# Patient Record
Sex: Male | Born: 1985 | Race: Black or African American | Hispanic: No | Marital: Married | Smoking: Never smoker
Health system: Southern US, Community
[De-identification: ages and names within clinical notes are randomized; demographics above are authoritative.]

## PROBLEM LIST (undated history)

## (undated) DIAGNOSIS — J45909 Unspecified asthma, uncomplicated: Secondary | ICD-10-CM

## (undated) NOTE — ED Triage Notes (Signed)
 Formatting of this note might be different from the original. Ricky Sosa is a 73 yr old male presenting with a chief complaint of abdominal pain,diarrhea and vomiting since Thursday.Symptoms started after dining out Electronically signed by Candia Alken (R.N.), R.N. at 12/23/2021  3:19 PM EDT

## (undated) NOTE — ED Provider Notes (Signed)
 Formatting of this note might be different from the original. CC:  ABDOMINAL PAIN  HPI: Ricky Sosa is a 86 yr old male with a h/o no PMH presenting with diarrhea x5/day for 4 days. Reports vomiting for first 3 days, none today. Has not tried medications. 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 Denies abdominal pain, blood in vomit or stool, fevers, travel, new foods or any other concerns.  PMH:  There is no problem list on file for this patient.  ALLERGIES No Known Allergies  Review of Systems:  CONSTITUTIONAL: Denies: chills, fever ENT: Denies: sore throat CARD: Denies: CP RESP: Denies: cough, SOB GU: Denies: dysuria, frequency NEURO: Denies: HA, dizziness SKIN: Denies: rash, skin changes  BP 123/78   Pulse 75   Temp 98.1 F (36.7 C) (Oral)   Resp 18   Ht 5' 6 (1.676 m)   Wt 199 lb (90.3 kg)   SpO2 99%   BMI 32.12 kg/m   Physical Exam: vitals wnl GEN: Vitals reviewed, no apparent distress, well-appearing HEENT: PERRL, EOMI ABDOMEN: Soft, mild diffuse ttp with more lower ttp, ND, no peritoneal signs, neg mcburneys and murphys, no HSM NEURO: Awake, alert, nl speech SKIN: Warm, dry, no rash  Assessment/Plan: Ricky Sosa is a 50 yr old male presenting with vomiting diarrhea. Likely causes include toxic food ingestion vs viral syndrome.  Low suspicion for appendicitis, cholecystitis, pancreatitis, pyelonephritis, severe colitis, or other acutely concerning explanation for the patients symptoms based on patient's history and presentation.  - Labs - covid/flu - Imaging -  not indicated - Meds - zofran, imodium - Patient education - Follow up with PCP  Completed by:   JONATHAN G FLEURAT MD, December 23, 2021, 3:44 PM  Patient understands importance of close follow up with PCP for further evaluation of symptoms, and to return immediately for worsening abdominal pain, fevers, new symptoms, or any other concerns.   Ricky Sosa  (M.D.), M.D. 12/23/21 1609  Electronically signed by Ricky Sosa (M.D.), M.D. at 12/23/2021  4:09 PM EDT

---

## 2006-12-01 ENCOUNTER — Emergency Department (HOSPITAL_COMMUNITY): Admission: EM | Admit: 2006-12-01 | Discharge: 2006-12-02 | Payer: Self-pay | Admitting: Emergency Medicine

## 2008-02-14 ENCOUNTER — Emergency Department (HOSPITAL_COMMUNITY): Admission: EM | Admit: 2008-02-14 | Discharge: 2008-02-14 | Payer: Self-pay | Admitting: Emergency Medicine

## 2010-10-30 ENCOUNTER — Ambulatory Visit (HOSPITAL_COMMUNITY): Admission: AD | Admit: 2010-10-30 | Discharge: 2010-10-30 | Payer: Self-pay | Attending: Urology | Admitting: Urology

## 2010-10-30 ENCOUNTER — Emergency Department (HOSPITAL_COMMUNITY)
Admission: EM | Admit: 2010-10-30 | Discharge: 2010-10-30 | Payer: Self-pay | Source: Home / Self Care | Admitting: Emergency Medicine

## 2010-10-31 LAB — URINALYSIS, ROUTINE W REFLEX MICROSCOPIC
Bilirubin Urine: NEGATIVE
Hgb urine dipstick: NEGATIVE
Ketones, ur: 15 mg/dL — AB
Nitrite: NEGATIVE
Protein, ur: NEGATIVE mg/dL
Specific Gravity, Urine: 1.028 (ref 1.005–1.030)
Urine Glucose, Fasting: NEGATIVE mg/dL
Urobilinogen, UA: 1 mg/dL (ref 0.0–1.0)
pH: 7 (ref 5.0–8.0)

## 2010-10-31 LAB — BASIC METABOLIC PANEL
BUN: 12 mg/dL (ref 6–23)
CO2: 26 mEq/L (ref 19–32)
Calcium: 9.3 mg/dL (ref 8.4–10.5)
Chloride: 103 mEq/L (ref 96–112)
Creatinine, Ser: 1.08 mg/dL (ref 0.4–1.5)
GFR calc Af Amer: >60 mL/min (ref >60)
GFR calc non Af Amer: >60 mL/min (ref >60)
Glucose, Bld: 117 mg/dL — ABNORMAL HIGH (ref 70–99)
Potassium: 3.7 mEq/L (ref 3.5–5.1)
Sodium: 139 mEq/L (ref 135–145)

## 2010-10-31 LAB — CBC
HCT: 42 % (ref 39.0–52.0)
Hemoglobin: 13.5 g/dL (ref 13.0–17.0)
MCH: 23.1 pg — ABNORMAL LOW (ref 26.0–34.0)
MCHC: 32.1 g/dL (ref 30.0–36.0)
MCV: 71.9 fL — ABNORMAL LOW (ref 78.0–100.0)
Platelets: 169 10*3/uL (ref 150–400)
RBC: 5.84 MIL/uL — ABNORMAL HIGH (ref 4.22–5.81)
RDW: 14.4 % (ref 11.5–15.5)
WBC: 7.1 10*3/uL (ref 4.0–10.5)

## 2010-10-31 LAB — DIFFERENTIAL
Basophils Absolute: 0 10*3/uL (ref 0.0–0.1)
Basophils Relative: 0 % (ref 0–1)
Eosinophils Absolute: 0 10*3/uL (ref 0.0–0.7)
Eosinophils Relative: 0 % (ref 0–5)
Lymphocytes Relative: 9 % — ABNORMAL LOW (ref 12–46)
Lymphs Abs: 0.7 10*3/uL (ref 0.7–4.0)
Monocytes Absolute: 0.4 10*3/uL (ref 0.1–1.0)
Monocytes Relative: 5 % (ref 3–12)
Neutro Abs: 6 10*3/uL (ref 1.7–7.7)
Neutrophils Relative %: 85 % — ABNORMAL HIGH (ref 43–77)

## 2010-11-26 NOTE — Consult Note (Signed)
NAMEJAMARION, Ricky Sosa               ACCOUNT NO.:  [id number]  MEDICAL RECORD NO.:  [id number]          PATIENT TYPE:  EMS  LOCATION:  [facility]                         FACILITY:  [facility]  PHYSICIAN:  Danae Chen, M.D.  DATE OF BIRTH:  [birth year]  DATE OF CONSULTATION:  10/30/2010 DATE OF DISCHARGE:                                CONSULTATION   REASON FOR CONSULTATION:  Right testicular pain.  The patient is 25 year old male who came to the emergency room this morning with history of sudden onset of severe right testicular pain. The pain is associated with swelling of the right scrotum.  He is sexually active.  He does not have any discharge from the penis.  He does not have any voiding symptoms.  He does not have any history of prior testicular pain.  A scrotal ultrasound showed no flow to the right testis.  I was then asked to see the patient in consultation for management of testicular torsion.  PAST MEDICAL HISTORY:  Negative for diabetes, hypertension.  He has a history of asthma and he had open heart surgery in the past.  SOCIAL HISTORY:  He is single.  He is a Consulting civil engineer at [employer]  He does not smoke nor drink.  FAMILY HISTORY:  His father and mother are alive and doing well.  MEDICATIONS:  None.  ALLERGIES:  No known drug allergies.  REVIEW OF SYSTEMS:  As noted in the HPI and everything else is negative.  PHYSICAL EXAMINATION:  GENERAL:  This is a well-developed 25 year old male who is complaining of severe testicular pain.  He is alert and oriented to time, place, and person. VITAL SIGNS:  His blood pressure is 136/62, pulse 59, respirations 20, temperature 98.  His head is normal.  He has pink conjunctivae. EARS, NOSE AND THROAT:  Within normal limits. NECK:  Supple.  He has no cervical adenopathy.  No thyromegaly. CHEST:  Symmetrical. LUNGS:  Fully expanded and clear to percussion and auscultation. HEART:  Regular rhythm.  His abdomen is  soft and nondistended, nontender.  He has no CVA tenderness.  Kidneys are not palpable. Bladder is not distended.  He has no hepatomegaly, no splenomegaly.  He has no inguinal hernia.  No inguinal adenopathy.  Bowel sounds are normal. GENITALIA:  Penis is circumcised.  Meatus is normal.  The right scrotum is swollen.  The right testicle is exquisitely tender.  The right spermatic cord is also tender. EXTREMITIES:  Within normal limits.  He has no pedal edema.  No deformities. SKIN:  Warm and dry.  LABORATORY DATA:  His hemoglobin is 13.5, hematocrit 42.0, and WBC 7.1, BUN 12, creatinine 1.08.  Sodium 139, potassium 3.7.  IMPRESSION:  Torsion of right testis.  PLAN:  Exploration of the scrotum,  bilateral orchiopexy, and possible right orchiectomy.  This was discussed in detail with the patient.  He understands that if the testicle is not viable that we would have to done orchiectomy.     Danae Chen, M.D.     MN/MEDQ  D:  10/30/2010  T:  10/30/2010  Job:  [id number]  Electronically Signed by Lindaann Slough M.D. on 10/31/2010 07:39:03 AM

## 2016-01-11 ENCOUNTER — Emergency Department (HOSPITAL_COMMUNITY)
Admission: EM | Admit: 2016-01-11 | Discharge: 2016-01-11 | Disposition: A | Discharge status: Home / Self Care | Payer: Self-pay | Attending: Emergency Medicine | Admitting: Emergency Medicine

## 2016-01-11 ENCOUNTER — Emergency Department (HOSPITAL_COMMUNITY): Payer: Self-pay

## 2016-01-11 ENCOUNTER — Encounter (HOSPITAL_COMMUNITY): Payer: Self-pay | Admitting: Emergency Medicine

## 2016-01-11 DIAGNOSIS — B9789 Other viral agents as the cause of diseases classified elsewhere: Secondary | ICD-10-CM

## 2016-01-11 DIAGNOSIS — R197 Diarrhea, unspecified: Secondary | ICD-10-CM | POA: Insufficient documentation

## 2016-01-11 DIAGNOSIS — M791 Myalgia: Secondary | ICD-10-CM | POA: Insufficient documentation

## 2016-01-11 DIAGNOSIS — R110 Nausea: Secondary | ICD-10-CM | POA: Insufficient documentation

## 2016-01-11 DIAGNOSIS — J45901 Unspecified asthma with (acute) exacerbation: Secondary | ICD-10-CM | POA: Insufficient documentation

## 2016-01-11 DIAGNOSIS — J069 Acute upper respiratory infection, unspecified: Secondary | ICD-10-CM | POA: Insufficient documentation

## 2016-01-11 HISTORY — DX: Unspecified asthma, uncomplicated: J45.909

## 2016-01-11 LAB — CBC WITH DIFFERENTIAL/PLATELET
Basophils Absolute: 0 10*3/uL (ref 0.0–0.1)
Basophils Relative: 1 %
Eosinophils Absolute: 0 10*3/uL (ref 0.0–0.7)
Eosinophils Relative: 2 %
HCT: 43.3 % (ref 39.0–52.0)
Hemoglobin: 13.9 g/dL (ref 13.0–17.0)
Lymphocytes Relative: 31 %
Lymphs Abs: 0.9 10*3/uL (ref 0.7–4.0)
MCH: 23.7 pg — ABNORMAL LOW (ref 26.0–34.0)
MCHC: 32.1 g/dL (ref 30.0–36.0)
MCV: 73.8 fL — ABNORMAL LOW (ref 78.0–100.0)
Monocytes Absolute: 0.4 10*3/uL (ref 0.1–1.0)
Monocytes Relative: 14 %
Neutro Abs: 1.4 10*3/uL — ABNORMAL LOW (ref 1.7–7.7)
Neutrophils Relative %: 52 %
Platelets: 159 10*3/uL (ref 150–400)
RBC: 5.87 MIL/uL — ABNORMAL HIGH (ref 4.22–5.81)
RDW: 14.7 % (ref 11.5–15.5)
WBC: 2.7 10*3/uL — ABNORMAL LOW (ref 4.0–10.5)

## 2016-01-11 LAB — BASIC METABOLIC PANEL
Anion gap: 9 (ref 5–15)
BUN: 6 mg/dL (ref 6–20)
CO2: 28 mmol/L (ref 22–32)
Calcium: 8.8 mg/dL — ABNORMAL LOW (ref 8.9–10.3)
Chloride: 101 mmol/L (ref 101–111)
Creatinine, Ser: 1.18 mg/dL (ref 0.61–1.24)
GFR calc Af Amer: >60 mL/min (ref >60)
GFR calc non Af Amer: >60 mL/min (ref >60)
Glucose, Bld: 94 mg/dL (ref 65–99)
Potassium: 3.9 mmol/L (ref 3.5–5.1)
Sodium: 138 mmol/L (ref 135–145)

## 2016-01-11 MED ORDER — SODIUM CHLORIDE 0.9 % IV BOLUS (SEPSIS)
1000.0000 mL | Freq: Once | INTRAVENOUS | Status: AC
  Administered 2016-01-11: 1000 mL via INTRAVENOUS

## 2016-01-11 MED ORDER — GUAIFENESIN-CODEINE 100-10 MG/5ML PO SOLN: 5.0000 mL | Freq: Four times a day (QID) | ORAL | Status: DC | PRN

## 2016-01-11 NOTE — Discharge Instructions (Signed)
Upper Respiratory Infection, Adult Most upper respiratory infections (URIs) are a viral infection of the air passages leading to the lungs. A URI affects the nose, throat, and upper air passages. The most common type of URI is nasopharyngitis and is typically referred to as "the common cold." URIs run their course and usually go away on their own. Most of the time, a URI does not require medical attention, but sometimes a bacterial infection in the upper airways can follow a viral infection. This is called a secondary infection. Sinus and middle ear infections are common types of secondary upper respiratory infections. Bacterial pneumonia can also complicate a URI. A URI can worsen asthma and chronic obstructive pulmonary disease (COPD). Sometimes, these complications can require emergency medical care and may be life threatening.  CAUSES Almost all URIs are caused by viruses. A virus is a type of germ and can spread from one person to another.  RISKS FACTORS You may be at risk for a URI if:   You smoke.   You have chronic heart or lung disease.  You have a weakened defense (immune) system.   You are very young or very old.   You have nasal allergies or asthma.  You work in crowded or poorly ventilated areas.  You work in health care facilities or schools. SIGNS AND SYMPTOMS  Symptoms typically develop 2-3 days after you come in contact with a cold virus. Most viral URIs last 7-10 days. However, viral URIs from the influenza virus (flu virus) can last 14-18 days and are typically more severe. Symptoms may include:   Runny or stuffy (congested) nose.   Sneezing.   Cough.   Sore throat.   Headache.   Fatigue.   Fever.   Loss of appetite.   Pain in your forehead, behind your eyes, and over your cheekbones (sinus pain).  Muscle aches.  DIAGNOSIS  Your health care provider may diagnose a URI by:  Physical exam.  Tests to check that your symptoms are not due to  another condition such as:  Strep throat.  Sinusitis.  Pneumonia.  Asthma. TREATMENT  A URI goes away on its own with time. It cannot be cured with medicines, but medicines may be prescribed or recommended to relieve symptoms. Medicines may help:  Reduce your fever.  Reduce your cough.  Relieve nasal congestion. HOME CARE INSTRUCTIONS   Take medicines only as directed by your health care provider.   Gargle warm saltwater or take cough drops to comfort your throat as directed by your health care provider.  Use a warm mist humidifier or inhale steam from a shower to increase air moisture. This may make it easier to breathe.  Drink enough fluid to keep your urine clear or pale yellow.   Eat soups and other clear broths and maintain good nutrition.   Rest as needed.   Return to work when your temperature has returned to normal or as your health care provider advises. You may need to stay home longer to avoid infecting others. You can also use a face mask and careful hand washing to prevent spread of the virus.  Increase the usage of your inhaler if you have asthma.   Do not use any tobacco products, including cigarettes, chewing tobacco, or electronic cigarettes. If you need help quitting, ask your health care provider. PREVENTION  The best way to protect yourself from getting a cold is to practice good hygiene.   Avoid oral or hand contact with people with cold   symptoms.   Wash your hands often if contact occurs.  There is no clear evidence that vitamin C, vitamin E, echinacea, or exercise reduces the chance of developing a cold. However, it is always recommended to get plenty of rest, exercise, and practice good nutrition.  SEEK MEDICAL CARE IF:   You are getting worse rather than better.   Your symptoms are not controlled by medicine.   You have chills.  You have worsening shortness of breath.  You have brown or red mucus.  You have yellow or brown nasal  discharge.  You have pain in your face, especially when you bend forward.  You have a fever.  You have swollen neck glands.  You have pain while swallowing.  You have white areas in the back of your throat. SEEK IMMEDIATE MEDICAL CARE IF:   You have severe or persistent:  Headache.  Ear pain.  Sinus pain.  Chest pain.  You have chronic lung disease and any of the following:  Wheezing.  Prolonged cough.  Coughing up blood.  A change in your usual mucus.  You have a stiff neck.  You have changes in your:  Vision.  Hearing.  Thinking.  Mood. MAKE SURE YOU:   Understand these instructions.  Will watch your condition.  Will get help right away if you are not doing well or get worse.   This information is not intended to replace advice given to you by your health care provider. Make sure you discuss any questions you have with your health care provider.   Document Released: 03/26/2001 Document Revised: 02/14/2015 Document Reviewed: 01/05/2014 Elsevier Interactive Patient Education 2016 Elsevier Inc[employer]  

## 2016-01-11 NOTE — ED Provider Notes (Signed)
CSN: [id number]     Arrival date & time 01/11/16  2108 History  By signing my name below, I, Tanda RockersMargaux Venter, attest that this documentation has been prepared under the direction and in the presence of Felicie Mornavid Mary-Ann Pennella, NP. Electronically Signed: Tanda RockersMargaux Venter, ED Scribe. 01/11/2016. 9:40 PM.   Chief Complaint  Patient presents with  . Shortness of Breath  . Headache  . Cough  . Generalized Body Aches   Patient is a 30 y.o. male presenting with shortness of breath, headaches, and cough. The history is provided by the patient. No language interpreter was used.  Shortness of Breath Severity:  Moderate Onset quality:  Gradual Duration:  5 days Timing:  Constant Progression:  Unchanged Chronicity:  New Relieved by:  None tried Worsened by:  Nothing tried Ineffective treatments:  None tried Associated symptoms: cough, fever and headaches   Associated symptoms: no abdominal pain and no vomiting   Headache Associated symptoms: cough, diarrhea, fever, myalgias and nausea   Associated symptoms: no abdominal pain and no vomiting   Cough Associated symptoms: chills, fever, headaches, myalgias and shortness of breath      HPI Comments: Ricky Sosa is a 30 y.o. male with PMHx asthma who presents to the Emergency Department complaining of gradual onset, constant, cough with shortness of breath x 5 days. Pt also complains of intermittent sharp posterior headache, generalized body aches, fever, chills, nausea, and diarrhea. He was taking Nyquil earlier in the week but stopped after it was giving him no relief. No recent sick contact but girlfriend does work at a school and could have picked something up there. Denies vomiting, abdominal pain, or any other associated symptoms.   Past Medical History  Diagnosis Date  . Asthma    No past surgical history on file. No family history on file. Social History  Substance Use Topics  . Smoking status: Never Smoker   . Smokeless tobacco: Not on file  .  Alcohol Use: Not on file    Review of Systems  Constitutional: Positive for fever and chills.  Respiratory: Positive for cough and shortness of breath.   Gastrointestinal: Positive for nausea and diarrhea. Negative for vomiting and abdominal pain.  Musculoskeletal: Positive for myalgias.  Neurological: Positive for headaches.  All other systems reviewed and are negative.  Allergies  Review of patient's allergies indicates no known allergies.  Home Medications   Prior to Admission medications   Not on File   BP 127/67 mmHg  Pulse 83  Temp(Src) 98.6 F (37 C) (Oral)  Resp 16  Ht 5\' 6"  (1.676 m)  Wt 205 lb (92.987 kg)  BMI 33.10 kg/m2  SpO2 100%   Physical Exam  Constitutional: He is oriented to person, place, and time. He appears well-developed and well-nourished. No distress.  HENT:  Head: Normocephalic and atraumatic.  Right Ear: Tympanic membrane normal.  Left Ear: Tympanic membrane normal.  Mouth/Throat: Posterior oropharyngeal erythema present.  Eyes: Conjunctivae and EOM are normal.  Neck: Neck supple. No tracheal deviation present.  Cardiovascular: Normal rate.   Pulmonary/Chest: Effort normal and breath sounds normal. No respiratory distress. He has no wheezes. He has no rales.  Abdominal: Soft. There is no tenderness.  Musculoskeletal: Normal range of motion.  Neurological: He is alert and oriented to person, place, and time.  Skin: Skin is warm and dry.  Psychiatric: He has a normal mood and affect. His behavior is normal.  Nursing note and vitals reviewed.   ED Course  Procedures (including critical  care time)  DIAGNOSTIC STUDIES: Oxygen Saturation is 100% on RA, normal by my interpretation.    COORDINATION OF CARE: 9:40 PM-Discussed treatment plan which includes CXR with pt at bedside and pt agreed to plan.   Labs Review Labs Reviewed  CBC WITH DIFFERENTIAL/PLATELET - Abnormal; Notable for the following:    WBC 2.7 (*)    RBC 5.87 (*)    MCV  73.8 (*)    MCH 23.7 (*)    Neutro Abs 1.4 (*)    All other components within normal limits  BASIC METABOLIC PANEL - Abnormal; Notable for the following:    Calcium 8.8 (*)    All other components within normal limits    Imaging Review Dg Chest 2 View  01/11/2016  CLINICAL DATA:  Acute onset of productive cough, fever, shortness of breath, diarrhea and generalized aching. Initial encounter. EXAM: CHEST  2 VIEW COMPARISON:  Chest radiograph from 02/14/2008 FINDINGS: The lungs are well-aerated and clear. There is no evidence of focal opacification, pleural effusion or pneumothorax. The heart is normal in size; the mediastinal contour is within normal limits. Postoperative change is noted at the left hilum. No acute osseous abnormalities are seen. IMPRESSION: No acute cardiopulmonary process seen. Electronically Signed   By: Roanna Raider M.D.   On: 01/11/2016 23:10   I have personally reviewed and evaluated these images and lab results as part of my medical decision-making.   EKG Interpretation None     Lab and radiology results reviewed and shared with patient. MDM   Final diagnoses:  None  Viral URI with cough.  Pt symptoms consistent with URI. CXR negative for acute infiltrate. Pt will be discharged with symptomatic treatment.  Discussed return precautions.  Pt is hemodynamically stable & in NAD prior to discharge.  I personally performed the services described in this documentation, which was scribed in my presence. The recorded information has been reviewed and is accurate.     Felicie Morn, NP 01/11/16 2321  Laurence Spates, MD 01/12/16 [phone]

## 2016-01-11 NOTE — ED Notes (Signed)
Pt reports four days of cough with body aches, headaches. Pt reports cough was productive but is now non productive. Pt also reports periods of chest pain and shortness of breath with coughing. Denies chest pain presently but reports shortness of breath. He has been using his inhaler at home without improvement. Pt also tried Nyquil and lemon water without help.

## 2016-11-23 ENCOUNTER — Emergency Department (HOSPITAL_COMMUNITY)
Admission: EM | Admit: 2016-11-23 | Discharge: 2016-11-23 | Disposition: A | Discharge status: Home / Self Care | Payer: Self-pay | Attending: Emergency Medicine | Admitting: Emergency Medicine

## 2016-11-23 ENCOUNTER — Encounter (HOSPITAL_COMMUNITY): Payer: Self-pay

## 2016-11-23 DIAGNOSIS — J45909 Unspecified asthma, uncomplicated: Secondary | ICD-10-CM | POA: Insufficient documentation

## 2016-11-23 DIAGNOSIS — R05 Cough: Secondary | ICD-10-CM | POA: Insufficient documentation

## 2016-11-23 DIAGNOSIS — R6889 Other general symptoms and signs: Secondary | ICD-10-CM

## 2016-11-23 DIAGNOSIS — R509 Fever, unspecified: Secondary | ICD-10-CM | POA: Insufficient documentation

## 2016-11-23 LAB — RAPID STREP SCREEN (MED CTR MEBANE ONLY): Streptococcus, Group A Screen (Direct): NEGATIVE

## 2016-11-23 MED ORDER — BENZONATATE 200 MG PO CAPS: 200.0000 mg | ORAL_CAPSULE | Freq: Three times a day (TID) | ORAL | 0 refills | Status: DC | PRN

## 2016-11-23 MED ORDER — ALBUTEROL SULFATE HFA 108 (90 BASE) MCG/ACT IN AERS
2.0000 | INHALATION_SPRAY | RESPIRATORY_TRACT | Status: DC | PRN
  Administered 2016-11-23: 2 via RESPIRATORY_TRACT
  Filled 2016-11-23: qty 6.7

## 2016-11-23 NOTE — ED Notes (Signed)
Pt stable, ambulatory, states understanding of discharge instructions 

## 2016-11-23 NOTE — Discharge Instructions (Signed)
Take tylenol and ibuprofen as needed for fever or body aches.

## 2016-11-23 NOTE — ED Triage Notes (Signed)
Flu -like symptoms began on Monday.  Cough, sore throat , diarrhea , chills , denies any vomiting.  Pt. Is alert and oriented X4  Skin is warm and dry.

## 2016-11-23 NOTE — ED Provider Notes (Signed)
[facility]EM[facility]ENCY DEPT Provider Note    By signing my name below, I, Earmon Phoenix, attest that this documentation has been prepared under the direction and in the presence of [facility], [place]. Electronically Signed: Earmon Phoenix, ED Scribe. 11/23/16. 7:30 PM.    History   Chief Complaint Chief Complaint  Patient presents with  . Cough  . Generalized Body Aches  . Sore Throat    The history is provided by the patient and medical records. No language interpreter was used.    Ricky Sosa is a 31 y.o. male with PMHx of asthma who presents to the Emergency Department complaining of improving flu like symptoms that began five days ago. He reports associated cough, sore throat, fever (Tmax 100.6), chills, body aches, nausea and diarrhea. He reports some mild wheezing that has since resolved. He has taken NyQuil and done salt water gargles with minimal relief. He denies modifying factors. He denies vomiting, abdominal pain, difficulty breathing or swallowing.   Past Medical History:  Diagnosis Date  . Asthma     There are no active problems to display for this patient.   History reviewed. No pertinent surgical history.     Home Medications    Prior to Admission medications   Medication Sig Start Date End Date Taking? Authorizing Provider  benzonatate (TESSALON) 200 MG capsule Take 1 capsule (200 mg total) by mouth 3 (three) times daily as needed for cough. 11/23/16   Jackelynn Hosie Orlene Och, NP  guaiFENesin-codeine 100-10 MG/5ML syrup Take 5 mLs by mouth every 6 (six) hours as needed for cough. 01/11/16   Felicie Morn, NP    Family History No family history on file.  Social History Social History  Substance Use Topics  . Smoking status: Never Smoker  . Smokeless tobacco: Never Used  . Alcohol use No     Comment: occasional      Allergies   Patient has no known allergies.   Review of Systems Review of Systems  Constitutional: Positive for chills. Negative for  diaphoresis, fatigue and fever.  HENT: Positive for sore throat. Negative for congestion, dental problem, ear pain, facial swelling, sinus pressure and trouble swallowing.   Eyes: Negative for photophobia, pain and discharge.  Respiratory: Positive for cough and wheezing. Negative for chest tightness and shortness of breath.   Gastrointestinal: Positive for diarrhea. Negative for abdominal distention, abdominal pain, constipation, nausea and vomiting.  Genitourinary: Negative for difficulty urinating, dysuria, flank pain and frequency.  Musculoskeletal: Positive for myalgias. Negative for back pain, gait problem, neck pain and neck stiffness.  Skin: Negative for color change and rash.  Neurological: Negative for dizziness, speech difficulty, weakness, light-headedness, numbness and headaches.  Psychiatric/Behavioral: Negative for agitation and confusion.     Physical Exam Updated Vital Signs BP 128/74 (BP Location: Left Arm)   Pulse 83   Temp 99.2 F (37.3 C) (Oral)   Resp 16   Ht 5\' 6"  (1.676 m)   Wt 210 lb (95.3 kg)   SpO2 99%   BMI 33.89 kg/m   Physical Exam  Constitutional: He is oriented to person, place, and time. He appears well-developed and well-nourished. No distress.  HENT:  Head: Normocephalic.  Right Ear: Tympanic membrane and ear canal normal.  Left Ear: Tympanic membrane and ear canal normal.  Mouth/Throat: Uvula is midline and mucous membranes are normal. Posterior oropharyngeal erythema present. No oropharyngeal exudate, posterior oropharyngeal edema or tonsillar abscesses.  Eyes: EOM are normal. Pupils are equal, round, and reactive to light.  Sclera clear bilaterally.  Neck: Neck supple.  Cardiovascular: Normal rate and regular rhythm.   Pulmonary/Chest: Effort normal. He has no wheezes. He has no rales.  Abdominal: Soft. Bowel sounds are normal. There is no tenderness. There is no CVA tenderness.  Musculoskeletal: Normal range of motion.  Lymphadenopathy:      He has no cervical adenopathy.  Neurological: He is alert and oriented to person, place, and time. No cranial nerve deficit.  Skin: Skin is warm and dry.  Psychiatric: He has a normal mood and affect. His behavior is normal.  Nursing note and vitals reviewed.    ED Treatments / Results  DIAGNOSTIC STUDIES: Oxygen Saturation is 99% on RA, normal by my interpretation.   COORDINATION OF CARE: 7:26 PM- Will treat symptomatically. Return precautions discussed. Will provide work note. Pt verbalizes understanding and agrees to plan.  Medications - No data to display  Labs (all labs ordered are listed, but only abnormal results are displayed) Labs Reviewed  RAPID STREP SCREEN (NOT AT [facility])  CULTURE, GROUP A STREP [facility])     Radiology No results found.  Procedures Procedures (including critical care time)  Medications Ordered in ED Medications - No data to display   Initial Impression / Assessment and Plan / ED Course  I have reviewed the triage vital signs and the nursing notes.  Pertinent lab results that were available during my care of the patient were reviewed by me and considered in my medical decision making (see chart for details).    Patient with symptoms consistent with influenza.  Vitals are stable, low-grade fever. No signs of dehydration, tolerating PO's. Lungs are clear. Due to patient's presentation and physical exam a chest x-ray was not ordered bc likely diagnosis of flu. The patient understands that symptoms are greater than the recommended 24-48 hour window of treatment. Patient will be discharged with instructions to orally hydrate, rest, and use over-the-counter medications such as anti-inflammatories ibuprofen and Aleve for muscle aches and Tylenol for fever. Will give albuterol MDI. Patient will also be given a cough suppressant.  I personally performed the services described in this documentation, which was scribed in my presence. The recorded information  has been reviewed and is accurate.   Final Clinical Impressions(s) / ED Diagnoses   Final diagnoses:  Flu-like symptoms    New Prescriptions Discharge Medication List as of 11/23/2016  7:32 PM    START taking these medications   Details  benzonatate (TESSALON) 200 MG capsule Take 1 capsule (200 mg total) by mouth 3 (three) times daily as needed for cough., Starting Sat 11/23/2016, Print         [address] AngostoHope M Bryn Saline, NP 11/26/16 0207    Laurence Spatesachel Morgan Little, MD 11/27/16 1226

## 2016-11-27 LAB — CULTURE, GROUP A STREP (THRC)

## 2016-12-28 ENCOUNTER — Emergency Department (HOSPITAL_COMMUNITY)
Admission: EM | Admit: 2016-12-28 | Discharge: 2016-12-28 | Disposition: A | Discharge status: Home / Self Care | Payer: Self-pay | Attending: Emergency Medicine | Admitting: Emergency Medicine

## 2016-12-28 ENCOUNTER — Encounter (HOSPITAL_COMMUNITY): Payer: Self-pay | Admitting: Emergency Medicine

## 2016-12-28 ENCOUNTER — Emergency Department (HOSPITAL_COMMUNITY): Payer: Self-pay

## 2016-12-28 DIAGNOSIS — Z79899 Other long term (current) drug therapy: Secondary | ICD-10-CM | POA: Insufficient documentation

## 2016-12-28 DIAGNOSIS — R059 Cough, unspecified: Secondary | ICD-10-CM

## 2016-12-28 DIAGNOSIS — R05 Cough: Secondary | ICD-10-CM

## 2016-12-28 DIAGNOSIS — J45901 Unspecified asthma with (acute) exacerbation: Secondary | ICD-10-CM | POA: Insufficient documentation

## 2016-12-28 MED ORDER — ALBUTEROL SULFATE (2.5 MG/3ML) 0.083% IN NEBU
5.0000 mg | INHALATION_SOLUTION | Freq: Once | RESPIRATORY_TRACT | Status: AC
  Administered 2016-12-28: 5 mg via RESPIRATORY_TRACT
  Filled 2016-12-28: qty 6

## 2016-12-28 MED ORDER — ALBUTEROL SULFATE (5 MG/ML) 0.5% IN NEBU: 2.5000 mg | INHALATION_SOLUTION | Freq: Four times a day (QID) | RESPIRATORY_TRACT | 12 refills | Status: DC | PRN

## 2016-12-28 MED ORDER — PREDNISONE 20 MG PO TABS: 60.0000 mg | ORAL_TABLET | Freq: Every day | ORAL | 0 refills | Status: DC

## 2016-12-28 NOTE — ED Provider Notes (Signed)
[facility]-EMERGENCY DEPT Provider Note   CSN: [id number] Arrival date & time: 12/28/16  1857    By signing my name below, I, Valentino SaxonBianca Contreras, attest that this documentation has been prepared under the direction and in the presence of Buel ReamAlexandra Emir Nack, [place]. Electronically Signed: Valentino SaxonBianca Contreras, ED Scribe. 12/28/16. 7:54 PM.  History   Chief Complaint Chief Complaint  Patient presents with  . Cough   The history is provided by the patient and the spouse. No language interpreter was used.   HPI Comments: Ricky Sosa is a 31 y.o. male with PMHx of asthma who presents to the Emergency Department complaining of moderate, persistent, productive white phlegm cough followed by congestion and SOB onset three days. He describes having a feeling like he has "a clog in his throat that I can't get out". He also reports associated headaches and diarrhea that started 2 days ago. Pt notes taking OTC allergy medications (Claritin and Primatene) and using an inhaler at home with minimal relief. Pt denies fever, abdominal pain, nausea and vomiting.    Past Medical History:  Diagnosis Date  . Asthma     There are no active problems to display for this patient.   History reviewed. No pertinent surgical history.     Home Medications    Prior to Admission medications   Medication Sig Start Date End Date Taking? Authorizing Provider  albuterol (PROVENTIL) (5 MG/ML) 0.5% nebulizer solution Take 0.5 mLs (2.5 mg total) by nebulization every 6 (six) hours as needed for wheezing or shortness of breath. 12/28/16   Myriah Boggus M Jerremy Maione, PA-C  benzonatate (TESSALON) 200 MG capsule Take 1 capsule (200 mg total) by mouth 3 (three) times daily as needed for cough. 11/23/16   Hope Orlene OchM Neese, NP  guaiFENesin-codeine 100-10 MG/5ML syrup Take 5 mLs by mouth every 6 (six) hours as needed for cough. 01/11/16   Felicie Mornavid Smith, NP  predniSONE (DELTASONE) 20 MG tablet Take 3 tablets (60 mg total) by mouth daily. 12/28/16   Emi HolesAlexandra M  Cayman Brogden, PA-C    Family History No family history on file.  Social History Social History  Substance Use Topics  . Smoking status: Never Smoker  . Smokeless tobacco: Never Used  . Alcohol use No     Comment: occasional      Allergies   Patient has no known allergies.   Review of Systems Review of Systems  Constitutional: Negative for chills and fever.  HENT: Positive for congestion. Negative for facial swelling and sore throat.   Respiratory: Positive for cough and shortness of breath.   Cardiovascular: Negative for chest pain.  Gastrointestinal: Positive for diarrhea. Negative for abdominal pain, blood in stool, nausea and vomiting.  Genitourinary: Negative for dysuria.  Musculoskeletal: Negative for back pain.  Skin: Negative for rash and wound.  Neurological: Positive for headaches.  Psychiatric/Behavioral: The patient is not nervous/anxious.      Physical Exam Updated Vital Signs BP (!) 142/69 (BP Location: Left Arm)   Pulse 70   Temp 98.3 F (36.8 C) (Oral)   Resp 16   Ht 5\' 6"  (1.676 m)   Wt 95.3 kg   SpO2 99%   BMI 33.89 kg/m   Physical Exam  Constitutional: He appears well-developed and well-nourished. No distress.  HENT:  Head: Normocephalic and atraumatic.  Mouth/Throat: Oropharynx is clear and moist. No oropharyngeal exudate.  Eyes: Conjunctivae are normal. Pupils are equal, round, and reactive to light. Right eye exhibits no discharge. Left eye exhibits no discharge. No scleral  icterus.  Neck: Normal range of motion. Neck supple. No thyromegaly present.  Cardiovascular: Normal rate, regular rhythm, normal heart sounds and intact distal pulses.  Exam reveals no gallop and no friction rub.   No murmur heard. Pulmonary/Chest: Effort normal. No stridor. No respiratory distress. He has no wheezes. He has rales (Left lung).  Abdominal: Soft. Bowel sounds are normal. He exhibits no distension. There is no tenderness. There is no rebound and no guarding.    Musculoskeletal: He exhibits no edema.  Lymphadenopathy:    He has no cervical adenopathy.  Neurological: He is alert. Coordination normal.  Skin: Skin is warm and dry. No rash noted. He is not diaphoretic. No pallor.  Psychiatric: He has a normal mood and affect.  Nursing note and vitals reviewed.    ED Treatments / Results   DIAGNOSTIC STUDIES: Oxygen Saturation is 99% on RA, normal by my interpretation.    COORDINATION OF CARE: 7:49 PM Discussed treatment plan with pt at bedside which includes chest imaging and nebulizer treatment and pt agreed to plan.   Labs (all labs ordered are listed, but only abnormal results are displayed) Labs Reviewed - No data to display  EKG  EKG Interpretation None       Radiology Dg Chest 2 View  Result Date: 12/28/2016 CLINICAL DATA:  Cough. EXAM: CHEST  2 VIEW COMPARISON:  January 11, 2016 FINDINGS: The heart size and mediastinal contours are within normal limits. Both lungs are clear. The visualized skeletal structures are unremarkable. IMPRESSION: No active cardiopulmonary disease. Electronically Signed   By: Gerome Sam III M.D   On: 12/28/2016 20:50    Procedures Procedures (including critical care time)  Medications Ordered in ED Medications  albuterol (PROVENTIL) (2.5 MG/3ML) 0.083% nebulizer solution 5 mg (5 mg Nebulization Given 12/28/16 2009)     Initial Impression / Assessment and Plan / ED Course  I have reviewed the triage vital signs and the nursing notes.  Pertinent labs & imaging results that were available during my care of the patient were reviewed by me and considered in my medical decision making (see chart for details).     Patient with mild signs and symptoms of asthma/RAD. Oxygen saturation is above 99%. No accessory muscle use, no cyanosis. CXR shows no active cardiomegaly pulmonary disease. Treated in the ED albuterol nebulizer.  Patient feels improved after treatment. Will discharge with prednisone,  albuterol nebulizer solution. Pt instructed to follow up and establish care with PCP. Patient is hemodynamically stable. Discussed return precautions. Patient vitals stable throughout ED course and discharged in satisfactory condition.  Final Clinical Impressions(s) / ED Diagnoses   Final diagnoses:  Exacerbation of asthma, unspecified asthma severity, unspecified whether persistent  Cough    New Prescriptions Discharge Medication List as of 12/28/2016  9:30 PM    START taking these medications   Details  albuterol (PROVENTIL) (5 MG/ML) 0.5% nebulizer solution Take 0.5 mLs (2.5 mg total) by nebulization every 6 (six) hours as needed for wheezing or shortness of breath., Starting Sat 12/28/2016, Print    predniSONE (DELTASONE) 20 MG tablet Take 3 tablets (60 mg total) by mouth daily., Starting Sat 12/28/2016, Print        I personally performed the services described in this documentation, which was scribed in my presence. The recorded information has been reviewed and is accurate.     Emi Holes, PA-C 12/28/16 2200    Melene Plan, DO 12/29/16 2355

## 2016-12-28 NOTE — ED Triage Notes (Signed)
Pt reports productive white sputum cough for the past 3 days. Pt reports using allergy medicines and inhaler but still having cough and feeling congested.

## 2016-12-28 NOTE — Discharge Instructions (Signed)
Medications: Prednisone, albuterol  Treatment: Take prednisone as prescribed for 5 days. Use albuterol inhaler every 4-6 hours as needed for cough, shortness breath, or wheezing. Use nebulizer every 6 hours as needed for wheezing or shortness of breath unrelieved by inhaler.  Follow-up: Please follow-up and establish care with a primary care provider by calling the number circled on your discharge paperwork. Please return to the emergency department if you develop any new or worsening symptoms.

## 2016-12-28 NOTE — ED Notes (Signed)
Pt sts he has been using his albuterol inhaler about 4x day.  He also used to have a home nebulizer.

## 2017-05-31 ENCOUNTER — Emergency Department (HOSPITAL_COMMUNITY)
Admission: EM | Admit: 2017-05-31 | Discharge: 2017-05-31 | Disposition: A | Discharge status: Home / Self Care | Payer: Self-pay | Attending: Emergency Medicine | Admitting: Emergency Medicine

## 2017-05-31 ENCOUNTER — Emergency Department (HOSPITAL_COMMUNITY): Payer: Self-pay

## 2017-05-31 ENCOUNTER — Encounter (HOSPITAL_COMMUNITY): Payer: Self-pay | Admitting: *Deleted

## 2017-05-31 DIAGNOSIS — J45909 Unspecified asthma, uncomplicated: Secondary | ICD-10-CM | POA: Insufficient documentation

## 2017-05-31 DIAGNOSIS — R519 Headache, unspecified: Secondary | ICD-10-CM

## 2017-05-31 DIAGNOSIS — F10230 Alcohol dependence with withdrawal, uncomplicated: Secondary | ICD-10-CM | POA: Insufficient documentation

## 2017-05-31 DIAGNOSIS — R51 Headache: Secondary | ICD-10-CM

## 2017-05-31 DIAGNOSIS — F10930 Alcohol use, unspecified with withdrawal, uncomplicated: Secondary | ICD-10-CM

## 2017-05-31 LAB — BASIC METABOLIC PANEL
Anion gap: 12 (ref 5–15)
BUN: 13 mg/dL (ref 6–20)
CO2: 22 mmol/L (ref 22–32)
Calcium: 9 mg/dL (ref 8.9–10.3)
Chloride: 102 mmol/L (ref 101–111)
Creatinine, Ser: 1 mg/dL (ref 0.61–1.24)
GFR calc Af Amer: >60 mL/min (ref >60)
GFR calc non Af Amer: >60 mL/min (ref >60)
Glucose, Bld: 100 mg/dL — ABNORMAL HIGH (ref 65–99)
Potassium: 3.7 mmol/L (ref 3.5–5.1)
Sodium: 136 mmol/L (ref 135–145)

## 2017-05-31 LAB — CBC
HCT: 42.8 % (ref 39.0–52.0)
Hemoglobin: 13.7 g/dL (ref 13.0–17.0)
MCH: 23.5 pg — ABNORMAL LOW (ref 26.0–34.0)
MCHC: 32 g/dL (ref 30.0–36.0)
MCV: 73.3 fL — ABNORMAL LOW (ref 78.0–100.0)
Platelets: 166 10*3/uL (ref 150–400)
RBC: 5.84 MIL/uL — ABNORMAL HIGH (ref 4.22–5.81)
RDW: 14.9 % (ref 11.5–15.5)
WBC: 5.5 10*3/uL (ref 4.0–10.5)

## 2017-05-31 LAB — TROPONIN I: Troponin I: <0.03 ng/mL (ref <0.03)

## 2017-05-31 NOTE — ED Provider Notes (Signed)
[facility]PT Provider Note   CSN: [id number] Arrival date & time: 05/31/17  1716     History   Chief Complaint Chief Complaint  Patient presents with  . Headache    HPI Ricky Sosa is a 31 y.o. male.  The history is provided by the patient and medical records. No language interpreter was used.  Headache   Associated symptoms include nausea. Pertinent negatives include no vomiting.   Ricky Sosa is a 31 y.o. male  with a PMH of asthma who presents to the Emergency Department complaining of not feeling well x 1 week. Patient states that he was a daily heavy drinker for several years but on Saturday (one week ago) patient decided to quit drinking. Initially he was feeling irritable, having diffuse headaches and nauseous. Day 2-3, he had some tremors and would wake up in the middle of the night having palpitations. Drinking a lot of water seemed to improve palpitations. Nearly all of these symptoms have resolved, however he has continued to have headaches. No medications taken prior to arrival for symptoms. No history of migraines or headache disorders. No history of prior DT's or hospitalizations for ETOH. No drug use.   Past Medical History:  Diagnosis Date  . Asthma     There are no active problems to display for this patient.   History reviewed. No pertinent surgical history.     Home Medications    Prior to Admission medications   Medication Sig Start Date End Date Taking? Authorizing Provider  albuterol (PROVENTIL) (5 MG/ML) 0.5% nebulizer solution Take 0.5 mLs (2.5 mg total) by nebulization every 6 (six) hours as needed for wheezing or shortness of breath. 12/28/16  Yes Law, Waylan Boga, PA-C    Family History No family history on file.  Social History Social History  Substance Use Topics  . Smoking status: Never Smoker  . Smokeless tobacco: Never Used  . Alcohol use No     Comment: occasional      Allergies   Patient has no known  allergies.   Review of Systems Review of Systems  Gastrointestinal: Positive for nausea. Negative for abdominal pain, constipation, diarrhea and vomiting.  Neurological: Positive for light-headedness and headaches. Negative for dizziness and syncope.  All other systems reviewed and are negative.    Physical Exam Updated Vital Signs BP 129/74   Pulse 73   Temp 98 F (36.7 C) (Oral)   Resp 16   Ht 5\' 6"  (1.676 m)   Wt 93.4 kg (206 lb)   SpO2 98%   BMI 33.25 kg/m   Physical Exam  Constitutional: He is oriented to person, place, and time. He appears well-developed and well-nourished. No distress.  HENT:  Head: Normocephalic and atraumatic.  Cardiovascular: Normal rate, regular rhythm and normal heart sounds.   No murmur heard. Pulmonary/Chest: Effort normal and breath sounds normal. No respiratory distress.  Abdominal: Soft. He exhibits no distension. There is no tenderness.  Musculoskeletal: Normal range of motion.  Neurological: He is alert and oriented to person, place, and time.  Speech clear and goal oriented. CN 2-12 grossly intact. Normal finger-to-nose and rapid alternating movements. No tremors. No drift. Strength and sensation intact. Steady gait.  Skin: Skin is warm and dry.  Nursing note and vitals reviewed.    ED Treatments / Results  Labs (all labs ordered are listed, but only abnormal results are displayed) Labs Reviewed  BASIC METABOLIC PANEL - Abnormal; Notable for the following:  Result Value   Glucose, Bld 100 (*)    All other components within normal limits  CBC - Abnormal; Notable for the following:    RBC 5.84 (*)    MCV 73.3 (*)    MCH 23.5 (*)    All other components within normal limits  TROPONIN I    EKG  EKG Interpretation  Date/Time:  Saturday May 31 2017 17:41:19 EDT Ventricular Rate:  80 PR Interval:  140 QRS Duration: 100 QT Interval:  366 QTC Calculation: 422 R Axis:   92 Text Interpretation:  Normal sinus rhythm  Rightward axis Nonspecific T wave abnormality Abnormal ECG Confirmed by Blane Ohara [phone]) on 05/31/2017 10:43:18 PM       Radiology Dg Chest 2 View  Result Date: 05/31/2017 CLINICAL DATA:  Chest pain for 1 week. EXAM: CHEST  2 VIEW COMPARISON:  12/28/2016 FINDINGS: Cardiomediastinal silhouette is normal. Mediastinal contours appear intact. Postoperative changes in the left thorax, stable. There is no evidence of focal airspace consolidation, pleural effusion or pneumothorax. Osseous structures are without acute abnormality. Soft tissues are grossly normal. IMPRESSION: No active cardiopulmonary disease. Electronically Signed   By: Ted Mcalpine M.D.   On: 05/31/2017 19:05    Procedures Procedures (including critical care time)  Medications Ordered in ED Medications - No data to display   Initial Impression / Assessment and Plan / ED Course  I have reviewed the triage vital signs and the nursing notes.  Pertinent labs & imaging results that were available during my care of the patient were reviewed by me and considered in my medical decision making (see chart for details).    Ricky Sosa is a 31 y.o. male who presents to ED for one week of not feeling well while trying to quit drinking. Now 1 week sober and nearly all symptoms have resolved. Still having headaches. No focal neuro deficits on exam. No tremors. No seizures or history of seizures in the past. BP and HR wdl. Labs and EKG reassuring. No signs of severe withdrawal. Do not feel medication needed at this time to aid in symptom relief. NSAID's and hydration for headache. Reasons to return to ER discussed and all questions answered.   Patient seen by and discussed with Dr. Jodi Mourning who agrees with treatment plan.    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

## 2017-05-31 NOTE — ED Notes (Signed)
Pt understood dc material. NAD Noted 

## 2017-05-31 NOTE — ED Triage Notes (Signed)
Pt c/o a headache and chest pain for one week since he cut down on his alcohol consumption for a week  He feels faint for 2 days

## 2017-05-31 NOTE — Discharge Instructions (Signed)
It was my pleasure taking care of you today!   Increase hydration. Ibuprofen as needed for headache. You can take up to 800mg  three times a day.   Return to ER for slurred speech, change in mental status, new tremors, seizures, new or worsening symptoms, any additional concerns.

## 2017-08-01 ENCOUNTER — Ambulatory Visit: Payer: Self-pay | Admitting: Internal Medicine

## 2017-09-23 ENCOUNTER — Emergency Department (HOSPITAL_COMMUNITY)
Admission: EM | Admit: 2017-09-23 | Discharge: 2017-09-23 | Disposition: A | Discharge status: Home / Self Care | Payer: Self-pay | Attending: Emergency Medicine | Admitting: Emergency Medicine

## 2017-09-23 ENCOUNTER — Other Ambulatory Visit: Payer: Self-pay

## 2017-09-23 ENCOUNTER — Encounter (HOSPITAL_COMMUNITY): Payer: Self-pay

## 2017-09-23 DIAGNOSIS — J4520 Mild intermittent asthma, uncomplicated: Secondary | ICD-10-CM | POA: Insufficient documentation

## 2017-09-23 DIAGNOSIS — Z9109 Other allergy status, other than to drugs and biological substances: Secondary | ICD-10-CM | POA: Insufficient documentation

## 2017-09-23 LAB — COMPREHENSIVE METABOLIC PANEL
ALT: 48 U/L (ref 17–63)
ANION GAP: 9 (ref 5–15)
AST: 34 U/L (ref 15–41)
Albumin: 4.5 g/dL (ref 3.5–5.0)
Alkaline Phosphatase: 74 U/L (ref 38–126)
BUN: 15 mg/dL (ref 6–20)
CALCIUM: 9.3 mg/dL (ref 8.9–10.3)
CHLORIDE: 104 mmol/L (ref 101–111)
CO2: 26 mmol/L (ref 22–32)
Creatinine, Ser: 1.04 mg/dL (ref 0.61–1.24)
Glucose, Bld: 122 mg/dL — ABNORMAL HIGH (ref 65–99)
Potassium: 4.2 mmol/L (ref 3.5–5.1)
SODIUM: 139 mmol/L (ref 135–145)
Total Bilirubin: 0.4 mg/dL (ref 0.3–1.2)
Total Protein: 7.5 g/dL (ref 6.5–8.1)

## 2017-09-23 LAB — CBC WITH DIFFERENTIAL/PLATELET
BASOS PCT: 0 %
Basophils Absolute: 0 10*3/uL (ref 0.0–0.1)
EOS ABS: 0.1 10*3/uL (ref 0.0–0.7)
Eosinophils Relative: 3 %
HEMATOCRIT: 46.4 % (ref 39.0–52.0)
HEMOGLOBIN: 14.8 g/dL (ref 13.0–17.0)
LYMPHS ABS: 1.1 10*3/uL (ref 0.7–4.0)
Lymphocytes Relative: 26 %
MCH: 24 pg — ABNORMAL LOW (ref 26.0–34.0)
MCHC: 31.9 g/dL (ref 30.0–36.0)
MCV: 75.2 fL — ABNORMAL LOW (ref 78.0–100.0)
MONOS PCT: 4 %
Monocytes Absolute: 0.2 10*3/uL (ref 0.1–1.0)
NEUTROS ABS: 2.8 10*3/uL (ref 1.7–7.7)
NEUTROS PCT: 68 %
Platelets: 175 10*3/uL (ref 150–400)
RBC: 6.17 MIL/uL — AB (ref 4.22–5.81)
RDW: 15.3 % (ref 11.5–15.5)
WBC: 4.2 10*3/uL (ref 4.0–10.5)

## 2017-09-23 MED ORDER — ALBUTEROL SULFATE (5 MG/ML) 0.5% IN NEBU: 2.5000 mg | INHALATION_SOLUTION | Freq: Four times a day (QID) | RESPIRATORY_TRACT | 12 refills | Status: AC | PRN

## 2017-09-23 MED ORDER — LORATADINE 10 MG PO TABS: 10.0000 mg | ORAL_TABLET | Freq: Every day | ORAL | 0 refills | Status: AC

## 2017-09-23 MED ORDER — HYDROCORTISONE 1 % EX CREA: TOPICAL_CREAM | CUTANEOUS | 0 refills | Status: AC

## 2017-09-23 NOTE — Discharge Instructions (Signed)
Please read attached information. If you experience any new or worsening signs or symptoms please return to the emergency room for evaluation. Please follow-up with your primary care provider or specialist as discussed. Please use medication prescribed only as directed and discontinue taking if you have any concerning signs or symptoms.   °

## 2017-09-23 NOTE — ED Triage Notes (Addendum)
Pt here with complaint of headaches and fatigue. Pt states his apartment complex has mold. He states he has tried home remedies but has had consistent URI/HA. Pt alert and oriented. Pt also states he has a rash to his left ear and his buttocks.

## 2017-09-23 NOTE — ED Provider Notes (Signed)
[facility]   CSN: [id number] Arrival date & time: 09/23/17  1448   History   Chief Complaint Chief Complaint  Patient presents with  . Headache    HPI Ricky Sosa is a 31 y.o. male.  HPI    31 year old male presents today with numerous complaints.  Patient reports a past medical history of seasonal allergies and asthma.  He notes this past September proximal me 3 months ago there was mold in his house.  At that time he developed generalized itching, intermittent headaches, and worsening of his allergic symptoms.  Patient notes he has been using Benadryl intermittently.  He notes a area of irritation on his right ear that has now spread to his left ear and his buttock.  Patient notes no fever, no present headache, no other significant complaints.        Past Medical History:  Diagnosis Date  . Asthma     There are no active problems to display for this patient.   History reviewed. No pertinent surgical history.     Home Medications    Prior to Admission medications   Medication Sig Start Date End Date Taking? Authorizing Provider  albuterol (PROVENTIL) (5 MG/ML) 0.5% nebulizer solution Take 0.5 mLs (2.5 mg total) by nebulization every 6 (six) hours as needed for wheezing or shortness of breath. 09/23/17   Dejon Lukas, Tinnie GensJeffrey, PA-C  hydrocortisone cream 1 % Apply to affected area 2 times daily 09/23/17   Precious Segall, Tinnie GensJeffrey, PA-C  loratadine (CLARITIN) 10 MG tablet Take 1 tablet (10 mg total) by mouth daily. 09/23/17   Eyvonne MechanicHedges, Eliyana Pagliaro, PA-C    Family History History reviewed. No pertinent family history.  Social History Social History   Tobacco Use  . Smoking status: Never Smoker  . Smokeless tobacco: Never Used  Substance Use Topics  . Alcohol use: No    Comment: occasional   . Drug use: No     Allergies   Patient has no known allergies.   Review of Systems Review of Systems  All other systems  reviewed and are negative.    Physical Exam Updated Vital Signs BP (!) 148/62 (BP Location: Right Arm)   Pulse 97   Temp 98.5 F (36.9 C) (Oral)   Resp 18   SpO2 96%   Physical Exam  Constitutional: He is oriented to person, place, and time. He appears well-developed and well-nourished.  HENT:  Head: Normocephalic and atraumatic.  Eyes: Conjunctivae are normal. Pupils are equal, round, and reactive to light. Right eye exhibits no discharge. Left eye exhibits no discharge. No scleral icterus.  Neck: Normal range of motion. No JVD present. No tracheal deviation present.  Pulmonary/Chest: Effort normal. No stridor.  Neurological: He is alert and oriented to person, place, and time. Coordination normal.  Skin:  Dry scaling skin noted to the left earlobe, and right buttock, no signs of surrounding redness or infection  Psychiatric: He has a normal mood and affect. His behavior is normal. Judgment and thought content normal.  Nursing note and vitals reviewed.    ED Treatments / Results  Labs (all labs ordered are listed, but only abnormal results are displayed) Labs Reviewed  COMPREHENSIVE METABOLIC PANEL - Abnormal; Notable for the following components:      Result Value   Glucose, Bld 122 (*)    All other components within normal limits  CBC WITH DIFFERENTIAL/PLATELET - Abnormal; Notable for the following components:   RBC 6.17 (*)  MCV 75.2 (*)    MCH 24.0 (*)    All other components within normal limits    EKG  EKG Interpretation None       Radiology No results found.  Procedures Procedures (including critical care time)  Medications Ordered in ED Medications - No data to display   Initial Impression / Assessment and Plan / ED Course  I have reviewed the triage vital signs and the nursing notes.  Pertinent labs & imaging results that were available during my care of the patient were reviewed by me and considered in my medical decision making (see chart  for details).      Final Clinical Impressions(s) / ED Diagnoses   Final diagnoses:  Environmental allergies  Intermittent asthma without complication, unspecified asthma severity    Labs:   Imaging:  Consults:  Therapeutics:  Discharge Meds: Hydrocortisone, Claritin  Assessment/Plan: 31 year old male presents today with likely allergies.  Could likely be caused by mold in his house, his rash appears to be eczema, low suspicion for fungal infection.  Patient otherwise well-appearing in no acute distress.  He will be started on antihistamines, topical steroids, with encouragement to establish care with primary care return immediately with any new or worsening signs or symptoms.  Patient verbalized understanding and agreement to today's plan had no further questions or concerns.      ED Discharge Orders        Ordered    albuterol (PROVENTIL) (5 MG/ML) 0.5% nebulizer solution  Every 6 hours PRN     09/23/17 1916    loratadine (CLARITIN) 10 MG tablet  Daily     09/23/17 1916    hydrocortisone cream 1 %     09/23/17 1916       Rosalio LoudHedges, Libero Puthoff, PA-C 09/23/17 1934    Nira Connardama, Pedro Eduardo, MD 09/24/17 1544

## 2018-03-06 IMAGING — DX DG CHEST 2V
2 series · 2 of 2 positions shown · non-contrast
Comparison: 12/28/2016

CLINICAL DATA: Chest pain for 1 week.

EXAM:
CHEST  2 VIEW

[w chest pa]
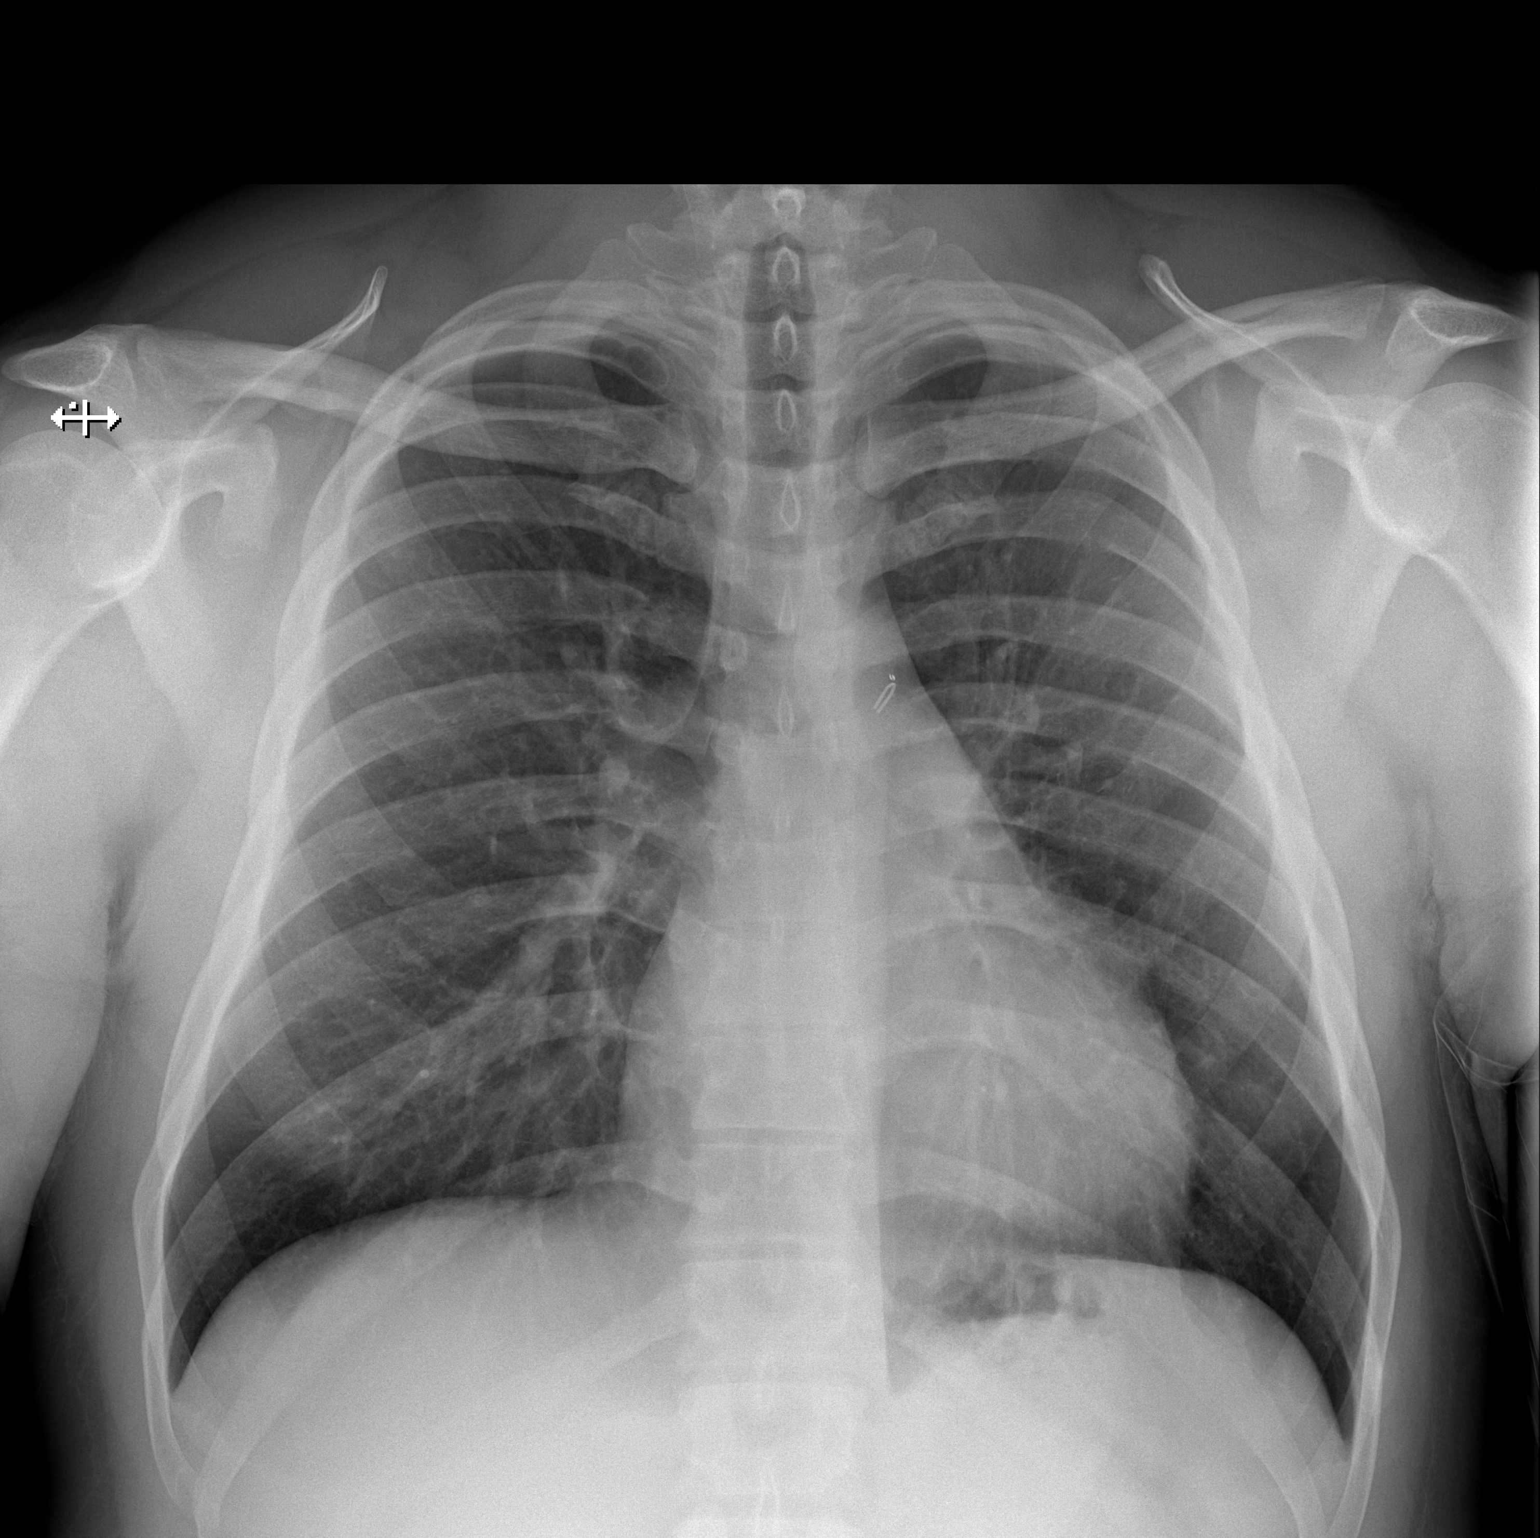

[w chest lat]
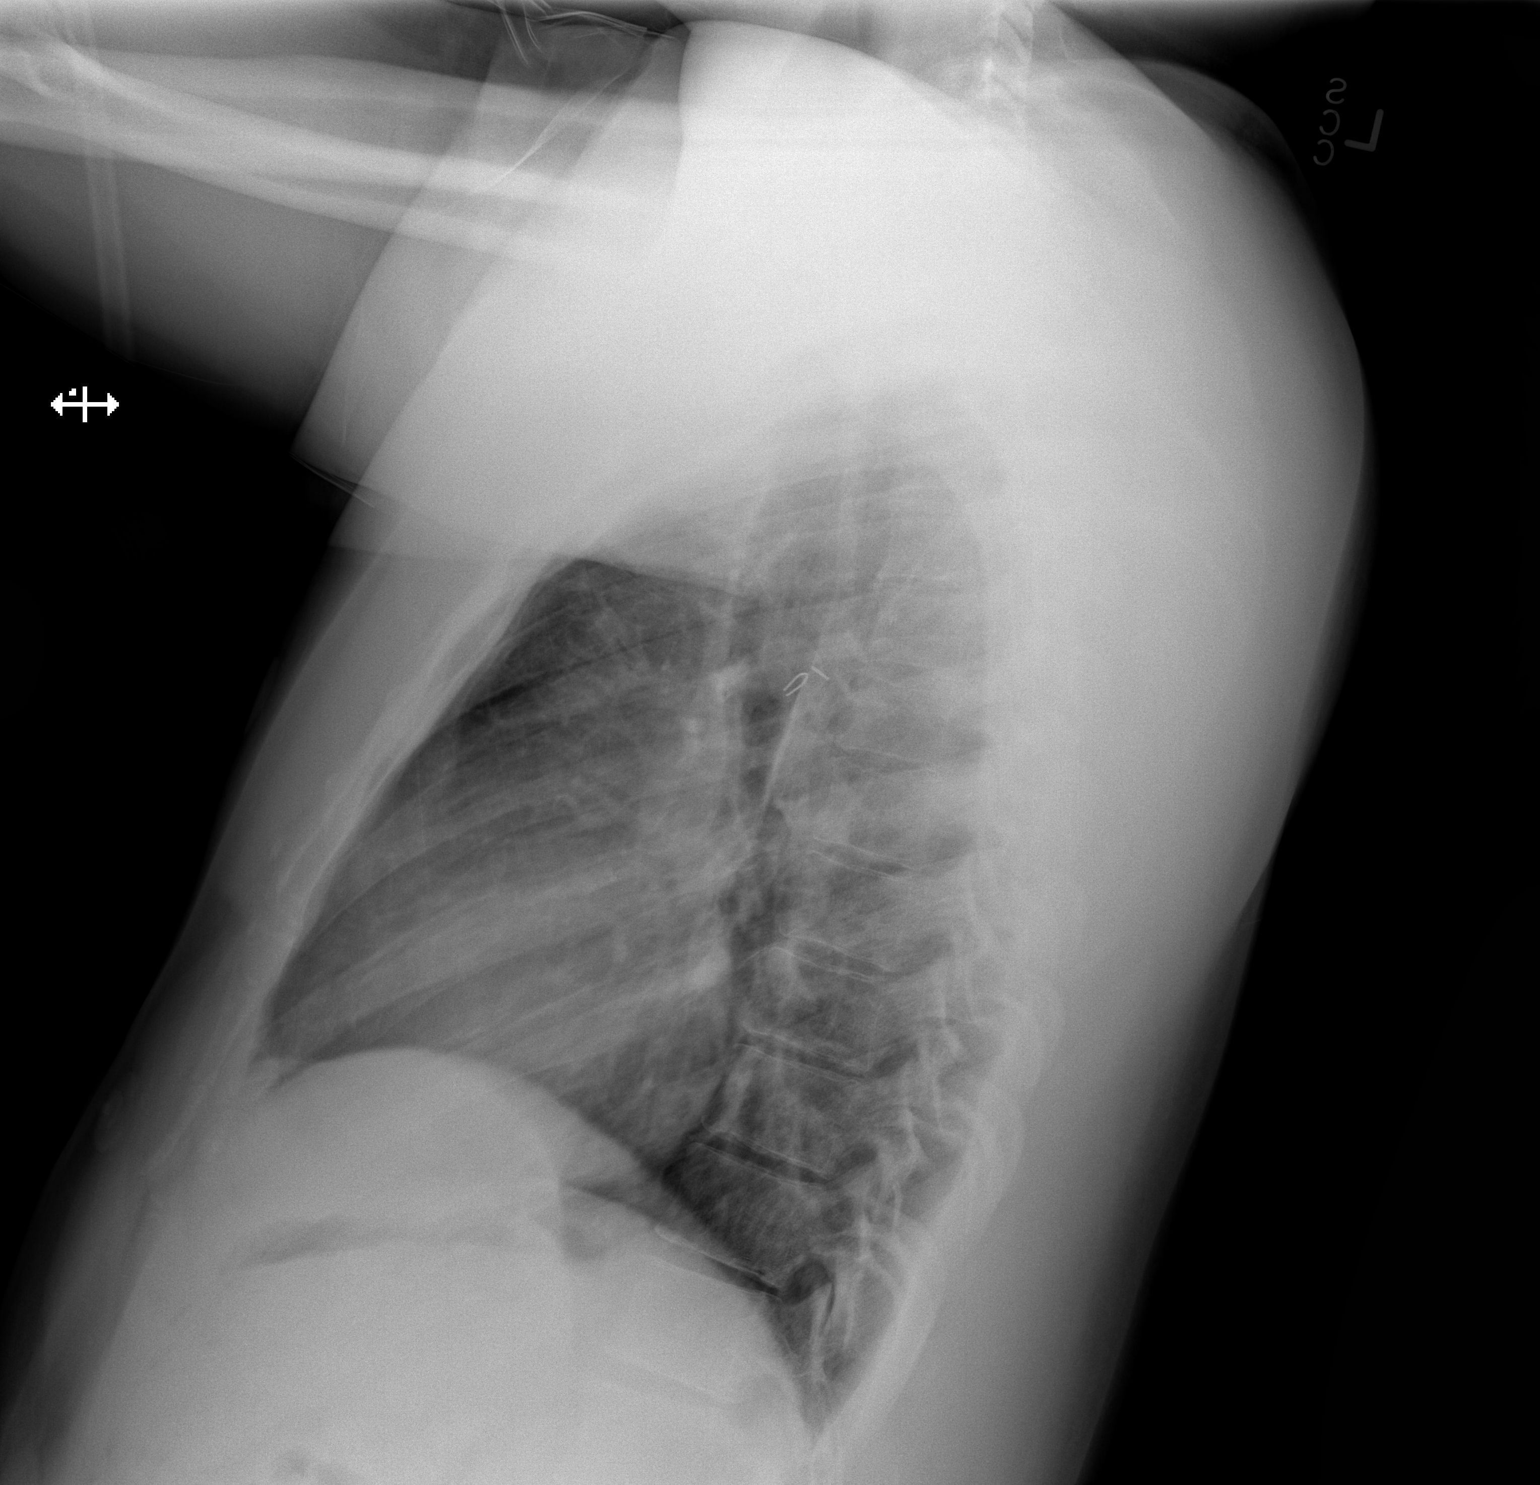

[2 of 2 positions shown; findings below may reference images not displayed]

FINDINGS: Cardiomediastinal silhouette is normal. Mediastinal contours appear
intact. Postoperative changes in the left thorax, stable.

There is no evidence of focal airspace consolidation, pleural
effusion or pneumothorax.

Osseous structures are without acute abnormality. Soft tissues are
grossly normal.
IMPRESSION: No active cardiopulmonary disease.

## 2019-12-15 ENCOUNTER — Ambulatory Visit: Payer: Self-pay | Attending: Internal Medicine

## 2019-12-15 ENCOUNTER — Ambulatory Visit: Payer: Self-pay

## 2019-12-15 DIAGNOSIS — Z20822 Contact with and (suspected) exposure to covid-19: Secondary | ICD-10-CM

## 2019-12-16 LAB — NOVEL CORONAVIRUS, NAA: SARS-CoV-2, NAA: NOT DETECTED

## 2023-12-12 ENCOUNTER — Emergency Department (HOSPITAL_COMMUNITY)
Admission: EM | Admit: 2023-12-12 | Discharge: 2023-12-12 | Disposition: A | Discharge status: Home / Self Care | Payer: Self-pay | Attending: Emergency Medicine | Admitting: Emergency Medicine

## 2023-12-12 ENCOUNTER — Encounter (HOSPITAL_COMMUNITY): Payer: Self-pay

## 2023-12-12 ENCOUNTER — Other Ambulatory Visit: Payer: Self-pay

## 2023-12-12 DIAGNOSIS — H1031 Unspecified acute conjunctivitis, right eye: Secondary | ICD-10-CM | POA: Insufficient documentation

## 2023-12-12 DIAGNOSIS — H5711 Ocular pain, right eye: Secondary | ICD-10-CM

## 2023-12-12 DIAGNOSIS — B9689 Other specified bacterial agents as the cause of diseases classified elsewhere: Secondary | ICD-10-CM | POA: Insufficient documentation

## 2023-12-12 DIAGNOSIS — H5789 Other specified disorders of eye and adnexa: Secondary | ICD-10-CM

## 2023-12-12 MED ORDER — FLUORESCEIN SODIUM 1 MG OP STRP
1.0000 | ORAL_STRIP | Freq: Once | OPHTHALMIC | Status: AC
  Administered 2023-12-12: 1 via OPHTHALMIC
  Filled 2023-12-12: qty 1

## 2023-12-12 MED ORDER — ERYTHROMYCIN 5 MG/GM OP OINT: TOPICAL_OINTMENT | OPHTHALMIC | 0 refills | Status: AC

## 2023-12-12 MED ORDER — TETRACAINE HCL 0.5 % OP SOLN
2.0000 [drp] | Freq: Once | OPHTHALMIC | Status: AC
Start: 2023-12-12 — End: 2023-12-12
  Administered 2023-12-12: 2 [drp] via OPHTHALMIC
  Filled 2023-12-12: qty 4

## 2023-12-12 NOTE — ED Provider Notes (Signed)
 [place] EMERGENCY DEPARTMENT AT [facility] Provider Note   CSN: [id number] Arrival date & time: 12/12/23  1412     History  Chief Complaint  Patient presents with   Eye Pain    Veto Macqueen is a 38 y.o. male.  The history is provided by the patient, medical records and the spouse. No language interpreter was used.  Eye Pain This is a new problem. The current episode started yesterday. The problem occurs constantly. The problem has not changed since onset.Pertinent negatives include no chest pain, no abdominal pain, no headaches and no shortness of breath. Nothing aggravates the symptoms. Nothing relieves the symptoms. He has tried nothing for the symptoms. The treatment provided no relief.       Home Medications Prior to Admission medications   Medication Sig Start Date End Date Taking? Authorizing Provider  albuterol (PROVENTIL) (5 MG/ML) 0.5% nebulizer solution Take 0.5 mLs (2.5 mg total) by nebulization every 6 (six) hours as needed for wheezing or shortness of breath. 09/23/17   Hedges, Tinnie Gens, PA-C  hydrocortisone cream 1 % Apply to affected area 2 times daily 09/23/17   Hedges, Tinnie Gens, PA-C  loratadine (CLARITIN) 10 MG tablet Take 1 tablet (10 mg total) by mouth daily. 09/23/17   Eyvonne Mechanic, PA-C      Allergies    Patient has no known allergies.    Review of Systems   Review of Systems  Constitutional:  Negative for chills, fatigue and fever.  HENT:  Negative for congestion.   Eyes:  Positive for pain, discharge and redness. Negative for visual disturbance.  Respiratory:  Negative for cough, chest tightness, shortness of breath and wheezing.   Cardiovascular:  Negative for chest pain.  Gastrointestinal:  Negative for abdominal pain, constipation, diarrhea, nausea and vomiting.  Genitourinary:  Negative for dysuria.  Musculoskeletal:  Negative for back pain.  Skin:  Negative for rash.  Neurological:  Negative for headaches.   Psychiatric/Behavioral:  Negative for agitation.   All other systems reviewed and are negative.   Physical Exam Updated Vital Signs BP 130/72 (BP Location: Left Arm)   Pulse 64   Temp 98.5 F (36.9 C) (Oral)   Resp 15   Ht 5\' 6"  (1.676 m)   Wt 93 kg   SpO2 100%   BMI 33.09 kg/m  Physical Exam Vitals and nursing note reviewed.  Constitutional:      General: He is not in acute distress.    Appearance: He is well-developed. He is not ill-appearing, toxic-appearing or diaphoretic.  HENT:     Head: Normocephalic and atraumatic.     Nose: No congestion or rhinorrhea.     Mouth/Throat:     Mouth: Mucous membranes are moist.     Pharynx: No oropharyngeal exudate or posterior oropharyngeal erythema.  Eyes:     General: Lids are normal.        Right eye: Discharge present.     Extraocular Movements: Extraocular movements intact.     Right eye: Normal extraocular motion and no nystagmus.     Left eye: Normal extraocular motion and no nystagmus.     Conjunctiva/sclera:     Right eye: Right conjunctiva is injected. No hemorrhage.    Pupils: Pupils are equal, round, and reactive to light.     Comments: Pt did not tolerate tonopen eval   Cardiovascular:     Rate and Rhythm: Normal rate and regular rhythm.     Heart sounds: No murmur heard. Pulmonary:  Effort: Pulmonary effort is normal. No respiratory distress.     Breath sounds: Normal breath sounds. No wheezing, rhonchi or rales.  Chest:     Chest wall: No tenderness.  Abdominal:     Palpations: Abdomen is soft.     Tenderness: There is no abdominal tenderness.  Musculoskeletal:        General: No swelling or tenderness.     Cervical back: Neck supple. No tenderness.  Skin:    General: Skin is warm and dry.     Capillary Refill: Capillary refill takes less than 2 seconds.     Findings: No erythema or rash.  Neurological:     General: No focal deficit present.     Mental Status: He is alert.  Psychiatric:         Mood and Affect: Mood normal.     ED Results / Procedures / Treatments   Labs (all labs ordered are listed, but only abnormal results are displayed) Labs Reviewed - No data to display  EKG None  Radiology No results found.  Procedures Procedures    Medications Ordered in ED Medications  fluorescein ophthalmic strip 1 strip (1 strip Right Eye Given 12/12/23 1916)  tetracaine (PONTOCAINE) 0.5 % ophthalmic solution 2 drop (2 drops Right Eye Given 12/12/23 1916)    ED Course/ Medical Decision Making/ A&P                                 Medical Decision Making Risk Prescription drug management.    Laurance Heide is a 38 y.o. male with a past medical history significant for asthma who presents with eye redness drainage and pain.  According to patient, for the last 2 days he has had pain in his right eye with some redness and what he thought was swelling.  He denies any blurry vision or double vision but reports some discomfort.  He reports it was crusty this morning.  It is slightly pink.  He reports he uses glasses.  He denies any head trauma.  Denies fevers, chills, nausea, vomiting, constipation, diarrhea, or urinary changes.  Denies any photophobia.  Denies any other complaints.  No rashes in the face or anywhere else on his body.  No sick contacts.  No personal or family history of glaucoma.  On exam, lungs clear.  Chest nontender.  Abdomen nontender.  Back nontender.  No carotid bruit.  Neck nontender.  Face nontender.  Patient has injected conjunctiva on the right eye but has symmetric and reactive pupils.  Normal extract movements.  No other tenderness present.  Visual fields intact.  Denies any blurry vision.  Clinically I suspect conjunctivitis but due to the amount of pain he was describing, will check pressure and look for scratches.  If Tono-Pen and fluorescein and Woods light evaluation is reassuring, anticipate discharge home with antibiotic ointment for suspected  conjunctivitis.   7:26 PM The tetracaine drops completely resolved his discomfort and symptoms and he did not have any evidence of ulcer or corneal abrasion on fluorescein exam.  We attempted to do Tono-Pen evaluation but he says he has a "thing with his eyes" and could not tolerate getting pressure check.  The fact that his symptoms completely resolved with the topical aesthetic, I have low suspicion for glaucoma at this time.  Patient agrees.  We had a shared decision conversation and will hold on further exam.  Will discharge with Romycin prescription  and outpatient follow-up with PCP.        Final Clinical Impression(s) / ED Diagnoses Final diagnoses:  Acute bacterial conjunctivitis of right eye  Eye irritation  Pain of right eye     Clinical Impression: 1. Acute bacterial conjunctivitis of right eye   2. Eye irritation   3. Pain of right eye     Disposition: Discharge  Condition: Good  I have discussed the results, Dx and Tx plan with the pt(& family if present). He/she/they expressed understanding and agree(s) with the plan. Discharge instructions discussed at great length. Strict return precautions discussed and pt &/or family have verbalized understanding of the instructions. No further questions at time of discharge.    Discharge Medication List as of 12/12/2023  7:29 PM     START taking these medications   Details  erythromycin ophthalmic ointment Place a 1/2 inch ribbon of ointment into the lower eyelid up to 6 times a day until resolution, Print        Follow Up: [facility] [address] [employer] [address] [place] [address] [phone] Schedule an appointment as soon as possible for a visit    [facility], [facility] [address] [place] [address] [phone]        Edmundo Tedesco, Canary Brim, MD 12/13/23 0020

## 2023-12-12 NOTE — ED Notes (Addendum)
 Pt c/o rtight eye pain describes as "soreness." Pain began "yesterday." Pt states that the eye was slightly swollen until OTC eye drops were used around 0700. Pt denies trauma or injury prior to onset of pain. Pain rated 4/10 and worsens with blinking and movement. PERRL.

## 2023-12-12 NOTE — Discharge Instructions (Signed)
 Your history, exam, and evaluation today are consistent with a bacterial infection called conjunctivitis causing the redness and discomfort and drainage.  The exam did not show evidence of corneal abrasion or ulceration and your exam was otherwise reassuring.  We have low suspicion for glaucoma or other acute cause at this time.  We feel you are safe for discharge home.  Please use the antibiotic ointment and follow-up with a PCP and ophthalmology if symptoms persist.  If any symptoms change or worsen acutely, please return to the nearest emergency department.

## 2023-12-12 NOTE — ED Triage Notes (Signed)
 Pt c/o right eye pain that started yesterday. Pt states eye is tender to touch. Pt is able to see out of eye but is blurry, does have an astigmatism in that eye. Pt denies any trauma to the eye.

## 2024-08-25 ENCOUNTER — Other Ambulatory Visit: Payer: Self-pay

## 2024-08-25 ENCOUNTER — Emergency Department (HOSPITAL_COMMUNITY)
Admission: EM | Admit: 2024-08-25 | Discharge: 2024-08-26 | Discharge status: Against Medical Advice | Payer: Self-pay | Attending: Emergency Medicine | Admitting: Emergency Medicine

## 2024-08-25 DIAGNOSIS — S61012A Laceration without foreign body of left thumb without damage to nail, initial encounter: Secondary | ICD-10-CM | POA: Insufficient documentation

## 2024-08-25 DIAGNOSIS — W260XXA Contact with knife, initial encounter: Secondary | ICD-10-CM | POA: Insufficient documentation

## 2024-08-25 DIAGNOSIS — Z5321 Procedure and treatment not carried out due to patient leaving prior to being seen by health care provider: Secondary | ICD-10-CM | POA: Insufficient documentation

## 2024-08-25 NOTE — ED Notes (Signed)
 Pt stated he was leaving.

## 2024-08-25 NOTE — ED Provider Triage Note (Signed)
 Emergency Medicine Provider Triage Evaluation Note  Bereket Gernert , a 38 y.o. male  was evaluated in triage.  Pt complains of laceration to left thumb. Happened about three hours ago while sharpening knife. Cleaned and wrapped at home. Came in for further evaluation and control bleeding.  UTD on tetanus.   Review of Systems  Positive: Laceration to left thumb. Negative: Dizziness, syncope.  Physical Exam  BP 133/73   Pulse 78   Temp 98.1 F (36.7 C) (Oral)   Resp 15   SpO2 99%  Gen:   Awake, no distress   Resp:  Normal effort  MSK:   Moves extremities without difficulty  Other:  About 1 inch laceration to distal medial portion of thumb. Bleeding well controlled and minimal bleeding noted on removed bandaging. Re-wrapped using 4x4 and roller gauze.  Medical Decision Making  Medically screening exam initiated at 9:55 PM.  Appropriate orders placed.  Shamar Engelmann was informed that the remainder of the evaluation will be completed by another provider, this initial triage assessment does not replace that evaluation, and the importance of remaining in the ED until their evaluation is complete.    Rosina Almarie LABOR, PA-C 08/25/24 2159
